# Patient Record
Sex: Female | Born: 2005 | Race: White | Hispanic: No | Marital: Single | Smoking: Never smoker
Health system: Southern US, Community
[De-identification: ages and names within clinical notes are randomized; demographics above are authoritative.]

## PROBLEM LIST (undated history)

## (undated) HISTORY — PX: NO PAST SURGERIES: SHX2092

---
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
## 2023-02-27 ENCOUNTER — Emergency Department
Admission: EM | Admit: 2023-02-27 | Discharge: 2023-02-27 | Disposition: A | Discharge status: Home / Self Care | Payer: BC Managed Care – PPO | Attending: Emergency Medicine | Admitting: Emergency Medicine

## 2023-02-27 ENCOUNTER — Other Ambulatory Visit: Payer: Self-pay

## 2023-02-27 DIAGNOSIS — R1010 Upper abdominal pain, unspecified: Secondary | ICD-10-CM | POA: Diagnosis present

## 2023-02-27 DIAGNOSIS — K2900 Acute gastritis without bleeding: Secondary | ICD-10-CM | POA: Insufficient documentation

## 2023-02-27 LAB — CBC
HCT: 40.9 % (ref 36.0–46.0)
Hemoglobin: 13.6 g/dL (ref 12.0–15.0)
MCH: 28.2 pg (ref 26.0–34.0)
MCHC: 33.3 g/dL (ref 30.0–36.0)
MCV: 84.9 fL (ref 80.0–100.0)
Platelets: 288 10*3/uL (ref 150–400)
RBC: 4.82 MIL/uL (ref 3.87–5.11)
RDW: 12.2 % (ref 11.5–15.5)
WBC: 7.4 10*3/uL (ref 4.0–10.5)
nRBC: 0 % (ref 0.0–0.2)

## 2023-02-27 LAB — BASIC METABOLIC PANEL
Anion gap: 11 (ref 5–15)
BUN: 7 mg/dL (ref 6–20)
CO2: 21 mmol/L — ABNORMAL LOW (ref 22–32)
Calcium: 9.2 mg/dL (ref 8.9–10.3)
Chloride: 107 mmol/L (ref 98–111)
Creatinine, Ser: 0.72 mg/dL (ref 0.44–1.00)
GFR, Estimated: >60 mL/min (ref >60)
Glucose, Bld: 95 mg/dL (ref 70–99)
Potassium: 4 mmol/L (ref 3.5–5.1)
Sodium: 139 mmol/L (ref 135–145)

## 2023-02-27 MED ORDER — PANTOPRAZOLE SODIUM 20 MG PO TBEC: 20.0000 mg | DELAYED_RELEASE_TABLET | Freq: Every day | ORAL | 1 refills | Status: AC

## 2023-02-27 NOTE — ED Notes (Signed)
Patient states for the last week has had black stool the color black tire. Having loose stool. Las BM yesterday around 12n. Having pain above belly button. Rate pain 7 an is light period cramps with sharp shooting pain. Has not taking anything for the pain.

## 2023-02-27 NOTE — Discharge Instructions (Signed)
Call make an appoint with Dr. Norma Fredrickson who is on-call for gastroenterology and make an appointment.  A prescription for Protonix was sent to the pharmacy for you to begin taking 1 daily.  Avoid any foods or beverages that could irritate your stomach such as spicy, fried foods, drinks that are high in acid such as orange juice and pineapple juice.  Also continue to monitor how many black stools you are seeing.  Avoid medications that contain iron and also Pepto-Bismol which will cause your stools to become black.  Return to the emergency department if you experience any dizziness, confusion, shortness of breath or feelings of weakness.

## 2023-02-27 NOTE — ED Triage Notes (Signed)
Pt to ED for black stool x1 week. Reports abd cramping.

## 2023-02-27 NOTE — ED Provider Notes (Signed)
[facility] Provider Note    Event Date/Time   First MD Initiated Contact with Patient 02/27/23 0920     (approximate)   History   Melena   HPI  Danielle Bender is a 17 y.o. female   presents to the ED with complaint of black stools noted for the last week.  Patient denies any use of Pepto-Bismol or iron tablets.  She is unaware of any food that has been different.  She denies use of alcohol or cigarettes.  Patient has had some discomfort in her upper abdomen for the past week but denies any stomach pain, bloating, burping or reflux symptoms.  No over-the-counter medications have been taken.  Patient denies any dizziness, weakness or palpitations.  No prior GI history.      Physical Exam   Triage Vital Signs: ED Triage Vitals [02/27/23 0822]  Enc Vitals Group     BP (!) 148/77     Pulse Rate (!) 103     Resp 16     Temp 97.8 F (36.6 C)     Temp Source Oral     SpO2 99 %     Weight 180 lb (81.6 kg)     Height 5\' 9"  (1.753 m)     Head Circumference      Peak Flow      Pain Score 7     Pain Loc      Pain Edu?      Excl. in GC?     Most recent vital signs: Vitals:   02/27/23 0822  BP: (!) 148/77  Pulse: (!) 103  Resp: 16  Temp: 97.8 F (36.6 C)  SpO2: 99%     General: Awake, no distress.  Alert, active, cooperative. CV:  Good peripheral perfusion.  Heart regular rate and rhythm. Resp:  Normal effort.  Lungs are clear bilaterally. Abd:  No distention.  Soft, flat with minimal epigastric tenderness.  Bowel sounds are normoactive x 4 quadrants.  Rectal exam was performed, no blood noted on exam and Hemoccult was negative. Other:     ED Results / Procedures / Treatments   Labs (all labs ordered are listed, but only abnormal results are displayed) Labs Reviewed  BASIC METABOLIC PANEL - Abnormal; Notable for the following components:      Result Value   CO2 21 (*)    All other components within normal limits  CBC       PROCEDURES:  Critical Care performed:   Procedures   MEDICATIONS ORDERED IN ED: Medications - No data to display   IMPRESSION / MDM / ASSESSMENT AND PLAN / ED COURSE  I reviewed the triage vital signs and the nursing notes.   Differential diagnosis includes, but is not limited to, gastritis, peptic ulcer disease, GI bleed.  17 year old female presents to the ED with history of black stools for the past week without prior history of peptic ulcer disease.  Rectal exam was Hemoccult negative.  Hemoglobin and hematocrit 13.6 and 40.9 respectively.  BMP was unremarkable and patient and mother was made aware.  We discussed starting on some Protonix daily and to continue monitoring her stools.  She is strongly encouraged to make an appointment with Dr. Norma Fredrickson who is on-call for gastroenterology for further workup.  Patient was told that should she develop any worsening of her symptoms such as dizziness, lightheadedness, shortness of breath or increased amount of black stools she is to return to the emergency department.  Clinical Course as of 02/27/23 1233  Sat Feb 27, 2023  1230 Hemoglobin: 13.6 [RS]  1230 HCT: 40.9 [RS]  1230 RBC: 4.82 [RS]    Clinical Course User Index [RS] Tommi Rumps, PA-C   Patient's presentation is most consistent with acute complicated illness / injury requiring diagnostic workup.  FINAL CLINICAL IMPRESSION(S) / ED DIAGNOSES   Final diagnoses:  Acute gastritis, presence of bleeding unspecified, unspecified gastritis type     Rx / DC Orders   ED Discharge Orders          Ordered    pantoprazole (PROTONIX) 20 MG tablet  Daily        02/27/23 1038             Note:  This document was prepared using Dragon voice recognition software and may include unintentional dictation errors.   Tommi Rumps, PA-C 02/27/23 1233    Shaune Pollack, MD 02/27/23 [phone]

## 2023-04-05 ENCOUNTER — Ambulatory Visit (INDEPENDENT_AMBULATORY_CARE_PROVIDER_SITE_OTHER): Payer: BC Managed Care – PPO

## 2023-04-05 DIAGNOSIS — K2950 Unspecified chronic gastritis without bleeding: Secondary | ICD-10-CM
